# Patient Record
Sex: Male | Born: 1960 | Race: White | Hispanic: No | Marital: Married | Smoking: Never smoker
Health system: Southern US, Community
[De-identification: ages and names within clinical notes are randomized; demographics above are authoritative.]

## PROBLEM LIST (undated history)

## (undated) HISTORY — PX: SPINE SURGERY: SHX786

---

## 2005-04-06 ENCOUNTER — Ambulatory Visit (HOSPITAL_COMMUNITY)
Admission: RE | Admit: 2005-04-06 | Discharge: 2005-04-06 | Payer: Self-pay | Admitting: Physical Medicine and Rehabilitation

## 2005-04-30 ENCOUNTER — Ambulatory Visit (HOSPITAL_COMMUNITY): Admission: RE | Admit: 2005-04-30 | Discharge: 2005-05-01 | Payer: Self-pay | Admitting: Specialist

## 2007-08-28 ENCOUNTER — Encounter: Admission: RE | Admit: 2007-08-28 | Discharge: 2007-08-28 | Payer: Self-pay | Admitting: Orthopedic Surgery

## 2007-09-13 ENCOUNTER — Inpatient Hospital Stay (HOSPITAL_COMMUNITY): Admission: AD | Admit: 2007-09-13 | Discharge: 2007-09-14 | Payer: Self-pay | Admitting: Orthopedic Surgery

## 2009-02-14 ENCOUNTER — Encounter: Admission: RE | Admit: 2009-02-14 | Discharge: 2009-02-14 | Payer: Self-pay | Admitting: Emergency Medicine

## 2009-02-25 ENCOUNTER — Ambulatory Visit (HOSPITAL_COMMUNITY): Admission: RE | Admit: 2009-02-25 | Discharge: 2009-02-25 | Payer: Self-pay | Admitting: Emergency Medicine

## 2009-08-25 ENCOUNTER — Inpatient Hospital Stay (HOSPITAL_COMMUNITY): Admission: RE | Admit: 2009-08-25 | Discharge: 2009-08-28 | Payer: Self-pay | Admitting: Neurosurgery

## 2010-03-31 ENCOUNTER — Encounter: Admission: RE | Admit: 2010-03-31 | Discharge: 2010-03-31 | Payer: Self-pay | Admitting: Emergency Medicine

## 2010-04-09 ENCOUNTER — Ambulatory Visit: Payer: Self-pay | Admitting: Oncology

## 2010-04-16 LAB — CBC WITH DIFFERENTIAL/PLATELET
BASO%: 0.7 % (ref 0.0–2.0)
EOS%: 1.6 % (ref 0.0–7.0)
HCT: 39.9 % (ref 38.4–49.9)
MCH: 33.2 pg (ref 27.2–33.4)
MCHC: 33.8 g/dL (ref 32.0–36.0)
NEUT%: 62.4 % (ref 39.0–75.0)
RBC: 4.07 10*6/uL — ABNORMAL LOW (ref 4.20–5.82)
RDW: 12.7 % (ref 11.0–14.6)
lymph#: 1.6 10*3/uL (ref 0.9–3.3)

## 2010-04-16 LAB — COMPREHENSIVE METABOLIC PANEL
ALT: 23 U/L (ref 0–53)
AST: 17 U/L (ref 0–37)
Calcium: 9.7 mg/dL (ref 8.4–10.5)
Chloride: 107 mEq/L (ref 96–112)
Creatinine, Ser: 0.88 mg/dL (ref 0.40–1.50)

## 2010-05-25 ENCOUNTER — Encounter: Payer: Self-pay | Admitting: Emergency Medicine

## 2010-05-31 ENCOUNTER — Emergency Department (HOSPITAL_COMMUNITY)
Admission: EM | Admit: 2010-05-31 | Discharge: 2010-05-31 | Payer: Self-pay | Source: Home / Self Care | Admitting: Emergency Medicine

## 2010-05-31 LAB — DIFFERENTIAL
Basophils Absolute: 0 10*3/uL (ref 0.0–0.1)
Lymphocytes Relative: 14 % (ref 12–46)
Lymphs Abs: 1.7 10*3/uL (ref 0.7–4.0)
Monocytes Absolute: 0.6 10*3/uL (ref 0.1–1.0)
Neutro Abs: 9.7 10*3/uL — ABNORMAL HIGH (ref 1.7–7.7)

## 2010-05-31 LAB — URINALYSIS, ROUTINE W REFLEX MICROSCOPIC
Ketones, ur: NEGATIVE mg/dL
Nitrite: NEGATIVE
Protein, ur: NEGATIVE mg/dL
Urobilinogen, UA: 0.2 mg/dL (ref 0.0–1.0)

## 2010-05-31 LAB — CBC
HCT: 41.2 % (ref 39.0–52.0)
Hemoglobin: 14.6 g/dL (ref 13.0–17.0)
MCV: 92.4 fL (ref 78.0–100.0)
WBC: 12.2 10*3/uL — ABNORMAL HIGH (ref 4.0–10.5)

## 2010-05-31 LAB — COMPREHENSIVE METABOLIC PANEL
ALT: 23 U/L (ref 0–53)
Alkaline Phosphatase: 63 U/L (ref 39–117)
CO2: 29 mEq/L (ref 19–32)
GFR calc non Af Amer: >60 mL/min (ref >60)
Glucose, Bld: 90 mg/dL (ref 70–99)
Potassium: 4.1 mEq/L (ref 3.5–5.1)
Sodium: 141 mEq/L (ref 135–145)
Total Bilirubin: 1.2 mg/dL (ref 0.3–1.2)

## 2010-07-21 LAB — CBC
HCT: 33.2 % — ABNORMAL LOW (ref 39.0–52.0)
HCT: 39.1 % (ref 39.0–52.0)
Hemoglobin: 11.6 g/dL — ABNORMAL LOW (ref 13.0–17.0)
MCHC: 35.4 g/dL (ref 30.0–36.0)
MCV: 97.8 fL (ref 78.0–100.0)
MCV: 97.9 fL (ref 78.0–100.0)
Platelets: 219 10*3/uL (ref 150–400)
RDW: 12.5 % (ref 11.5–15.5)
RDW: 12.8 % (ref 11.5–15.5)
WBC: 6.4 10*3/uL (ref 4.0–10.5)

## 2010-07-21 LAB — BASIC METABOLIC PANEL
BUN: 4 mg/dL — ABNORMAL LOW (ref 6–23)
BUN: 8 mg/dL (ref 6–23)
Chloride: 102 mEq/L (ref 96–112)
Chloride: 106 mEq/L (ref 96–112)
GFR calc non Af Amer: >60 mL/min (ref >60)
GFR calc non Af Amer: >60 mL/min (ref >60)
Glucose, Bld: 102 mg/dL — ABNORMAL HIGH (ref 70–99)
Glucose, Bld: 85 mg/dL (ref 70–99)
Potassium: 3.6 mEq/L (ref 3.5–5.1)
Potassium: 5.7 mEq/L — ABNORMAL HIGH (ref 3.5–5.1)
Sodium: 136 mEq/L (ref 135–145)

## 2010-07-21 LAB — POTASSIUM: Potassium: 4 mEq/L (ref 3.5–5.1)

## 2010-09-15 NOTE — Op Note (Signed)
NAMEBARRIE, WALE                ACCOUNT NO.:  [id number]   MEDICAL RECORD NO.:  [id number]          PATIENT TYPE:  OIB   LOCATION:  5035                         FACILITY:  [facility]   PHYSICIAN:  Alvy Beal, MD    DATE OF BIRTH:  12/22/1960   DATE OF PROCEDURE:  DATE OF DISCHARGE:                               OPERATIVE REPORT   PREOPERATIVE DIAGNOSIS:  Recurrent right L5-S1 disk herniation.   POSTOPERATIVE DIAGNOSIS:  Recurrent right L5-S1 disk herniation.   OPERATIVE PROCEDURE:  Lumbar laminectomy for diskectomy revision.   FIRST ASSISTANT:  Crissie Reese, PA   HISTORY:  Malcome is a very pleasant 50 year old gentleman who presents  with acute onset of severe right leg pain and slight weakness in the  right S1 distribution.  At this point, because of the patient's symptoms  an MRI was done, and I confirmed the sequestered fragment of disk  material due to recurrent L5-S1 disk herniation.  Attempts at  conservative management failed to alleviate symptoms.  All appropriate  risks, benefits, and alternatives were explained to the patient and  elected to proceed with revision diskectomy.   OPERATIVE NOTE:  The patient is brought to the operating room, placed  supine on the operating table.  After successful induction of general  anesthesia and endotracheal intubation, the SCDs and Foley were applied.  He was turned prone onto a Wilson frame and the back was prepped in  sterile fashion.  The previous lumbar incision was then marked out with  needles and then re-incised.  Sharp dissection was carried out down to  the deep fascia and spinous process.  I then exposed the spinous process  L5 and S1 in a portion of that of S2.  I then began clearing a soft  tissue to expose the lamina and L5-S1 facet complex and lamina of L4.  Self-retaining Taylor retractor was placed and I placed a probe  underneath the L5 lamina.  I confirmed with lateral plain radiographs  that was at the L5  lamina.  Once I confirmed this, because of the  location of the disk, I elected to perform a complete S1 laminectomy.  Using a fine curette, I developed a plain underneath the S1 lamina, and  then using a combination of 1, 2, and 3-mm Kerrison.  I performed a  complete laminectomy of S1.  At this point, I had clear visualizations  of the lateral gutter, and the S1 nerve root.  I then gently began  mobilizing the thecal sac.  I could clearly visualize the medial border  of the S1 pedicle.  I then began sweeping underneath the thecal sac with  a nerve hook and there was a two large fragments of disk material.  I  resected those very carefully to prevent any dural leak with a micro and  regular pituitary rongeur.  These very large fragments of disk were  removed then I noted that there was a significant decrease in the  tension on the thecal sac and S1 nerve root.  At this point, I irrigated  the wound copiously with normal  saline, and then took a long nerve hook,  and was able to sweep circumferentially underneath the thecal sac and  inferiorly down to the about the level of the S1-2 disk space and up  caudally up to the L5-S1 disk space.  I was also able to palpate the  lateral recess from the L5-S1 disk down to the S1-S2 disk.  I then  palpated with a hockey stick (dural elevator) out the S1 neuroforamen  and I noted there was no significant undue pressure.  At this point, I  had removed very large fragments of disk material consistent with that  which I visualized on the preoperative MRI.  In addition based on  clinical direct evaluation and palpation and Healon, there was no  further retained fragments of disk material.   Satisfied with the diskectomy.  I irrigated the wound copiously with  normal saline used bipolar electrocautery to obtain hemostasis and  maintained with FloSeal.  I then closed the deep fascia with interrupted  #1 Vicryl sutures,  superficial 2-0 Vicryl sutures, and  3-0 Monocryl for the skin.  Steri-  Strips and dry dressings were applied.  He was extubated and transferred  PACU without incident.  At the end of the case, all needle, and sponge  counts were correct.      Alvy Beal, MD  Electronically Signed     DDB/MEDQ  D:  09/13/2007  T:  09/14/2007  Job:  [id number]

## 2010-09-18 NOTE — Consult Note (Signed)
[name], [name]                ACCOUNT NO.:  [id number]   MEDICAL RECORD NO.:  [id number]          PATIENT TYPE:  OIB   LOCATION:  5011                         FACILITY:  [facility]   PHYSICIAN:  Lindaann Slough, M.D.  DATE OF BIRTH:  July 16, 1960   DATE OF CONSULTATION:  04/30/2005  DATE OF DISCHARGE:  05/01/2005                                   CONSULTATION   REASON FOR CONSULTATION:  Inability to urinate.   HISTORY OF PRESENT ILLNESS:  The patient is a 50 year old male who had  microdiskectomy L5-S1 today by Dr. Otelia Sergeant. Postoperatively, he had difficulty  voiding. A #16 Foley catheter was inserted in the bladder and drained 1500  cc of urine. The patient stated that in the past, he had difficulty voiding  when he had a thumb surgery 15 years ago and also when he takes analgesics.  For the past year and a half, he has been on Flomax and if he does not take  Flomax, he has hesitancy, straining on urination and sensation of incomplete  emptying of the bladder. He has no hematuria. He has no history of urethral  trauma.   PAST MEDICAL HISTORY:  Negative.   MEDICATIONS:  Flomax 1 capsule daily.   ALLERGIES:  NO KNOWN DRUG ALLERGIES.   PAST SURGICAL HISTORY:  He had left thumb surgery 15 years ago.   SOCIAL HISTORY:  He is married and has 3 children. Does not smoke. Drinks  occasionally.   FAMILY HISTORY:  His father died at age 49. His mother died at age 79 of  unknown causes to him. He has 2 sisters and 1 brother.   REVIEW OF SYSTEMS:  He is status post microdiskectomy and he had difficulty  voiding postoperatively. Everything else is negative.   PHYSICAL EXAMINATION:  GENERAL:  This is a well built 50 year old male who  is in no acute distress at this time.  NEUROLOGIC:  He is oriented to person, place, and time.  VITAL SIGNS:  Blood pressure 115/80, pulse 74, respiratory rate 16,  temperature 97.6.  SKIN:  Warm and dry.  ABDOMEN:  Soft, nontender, and nondistended. He has  no CVA tenderness. He  has no hepatosplenomegaly and no splenomegaly. He has no inguinal hernia.  GENITOURINARY:  Kidney's are not palpable. Bladder is not distended. He has  no tenderness in the suprapubic area. Penis is circumcised. Meatus is  normal. He has a Foley catheter in place that is draining clear urine.  Scrotum is normal. He has no hydrocele. No testicular mass. Cords and  epididymides are within normal limits. Anal and perineal skin is normal.  RECTAL:  Sphincter tone is normal. He has no external hemorrhoids. Prostate  is enlarged at 30 grams. No nodules, seminal vesicles not palpable.   IMPRESSION:  Urinary retention.   PLAN:  Continue Flomax 0.4 mg daily and remove Foley catheter in the morning  for voiding trial. Can be discharged on Flomax 1 or 2 cap daily.  If he  continues to have difficulty voiding, we will then replace the Foley  catheter.  He could then go  home with Foley catheter and we will follow him  up as an outpatient.      Lindaann Slough, M.D.  Electronically Signed     MN/MEDQ  D:  05/01/2005  T:  05/02/2005  Job:  [id number]   cc:   Kerrin Champagne, M.D.  Fax: [phone]

## 2010-09-18 NOTE — Op Note (Signed)
NAMEMOISHE, SCHELLENBERG                ACCOUNT NO.:  [id number]   MEDICAL RECORD NO.:  [id number]          PATIENT TYPE:  OIB   LOCATION:  5011                         FACILITY:  [facility]   PHYSICIAN:  Kerrin Champagne, M.D.   DATE OF BIRTH:  [birth year]   DATE OF PROCEDURE:  04/30/2005  DATE OF DISCHARGE:  05/01/2005                                 OPERATIVE REPORT   PREOPERATIVE DIAGNOSIS:  Herniated nucleus pulposus right L5-S1 with  persistent recurring right S1 radiculopathy.   POSTOPERATIVE DIAGNOSIS:  Herniated nucleus pulposus right L5-S1 with  persistent recurring right S1 radiculopathy.   PROCEDURE:  Right L5-S1 microdiskectomy.   SURGEON:  Kerrin Champagne, M.D.   ASSISTANT:  Maud Deed, P.A.-C.   ANESTHESIA:  General via orotracheal intubation supplemented with local  infiltration of Marcaine 0.5% with 1:200,000 epinephrine 90 mL.   ATTENDING ANESTHESIOLOGIST:  Burna Forts, M.D.   SPECIMENS:  Disk material right L5-S1.   ESTIMATED BLOOD LOSS:  10 cc.   COMPLICATIONS:  The patient returned to PACU in good condition.   CLINICAL HISTORY:  The patient is a 50 year old male who injured his back  almost a period of four years ago.  He underwent a nucleoplasty and did well  up until this past August where he was working as a Designer, fashion/clothing and had worsening  pain and discomfort.  He noticed that he jumped from a bridge to inspect  wreckage where his son had driven off an embankment or bridge and apparently  misjudged the height.  He jumped through trees, landing on the ground at 35  feet below.  He had been experiencing pain and discomfort since that time.  The pain is down the posterior aspect of his right leg in S1 distribution.  He has reflexes which are relatively consistent and symmetric, both sides,  positive straight leg raise sign on the right, positive popliteal  compression sign.  Weakness with toe walking on the right side. The  patient's MRI study shows protruded  disk, right side L5-S, retrolisthesis of  L5 and S1.  There is displacement of the right S1 nerve root secondary to  HNP which is prominent and located right at the right posterior disk margin.   INTRAOPERATIVE FINDINGS:  The patient found to have focal disk protrusion  right side central affecting primarily the right side of thecal sac, right  S1 nerve root.   DESCRIPTION OF PROCEDURE:  After adequate general anesthesia, the patient a  knee-chest position, Andrews frame, standard preoperative antibiotics.  Standard prep.  All pressure points well-padded.  Prepped with DuraPrep  solution, draped in the usual manner.  Spinal needles placed at the expected  L5-S1 level.  Intraoperative lateral radiograph demonstrating the upper  needle directly across from the disk.  The incision was then based at this  level after infiltration of the skin with Marcaine 0.5% with 1:200  epinephrine.  The incision was approximately an inch in length, through skin  and subcutaneous layers directly down to the lumbar dorsal fascia.  The  lumbar dorsal fascia incised on the right side of  spinous process of S1 and  of L5.  Cobb used to elevate paralumbar muscles on the right side off of the  right side lamina of L5 exposing the posterior aspect the L5 lamina, the  inferior aspect and the L5-S1 posterior laminar space on the right side.  McCullough retractor inserted.  The soft tissue around the area of the  incision was then infiltrated with Marcaine 0.5% with 200,000 epinephrine,  an additional 3 or 4 mL.  Next, a curet was used carefully to free up the  ligament of flavum on the right side, off of the superior aspect of the  lamina of S1 and over the medial aspect of the facet at the L5-S1 level.  A  3 mm Kerrison was used to remove a small portion on the inferior aspect  lamina on the right side forming a semi-hemilaminectomy on this side,  widening the opening of the interlaminar space.  Next a nerve hook  used to  carefully elevate the ligament of flavum off of the S1 lamina and 3 mm  Kerrison then used to resect the ligament of flavum on the right side.  The  patient had a generous amount epidural fat along the right side.  This was  preserved. The facet capsule on the right side was then debrided using 3 mm  Kerrison. Lateral recess decompressed with this.  The S1 nerve root  identified and then carefully freed up off the lateral margin of the lateral  recess and the S1 pedicle and retracted medially using a D'Errico.  Disk  rupture noted at the L5-S1 disk space on the right side immediately present.  With the lateral aspect of thecal sac and S1 nerve root carefully protected  with the D'Errico, then a 15 blade scalpel used to incise longitudinally  the disk on the right side L5-S1 immediately disk material extruded  indicating that the disk was under pressure and was causing mass effect on  his S1 nerve root.  The extruded disk material was then extracted using  micropituitaries as well as straight pituitaries with teeth.  The disk space  was then entered lightly using micropituitary, upbiting straight and  downbiting pituitary debriding disk material locally and superficially where  it was loose and may cause recurrent disk protrusion otherwise.  This  completed, then a hockey stick neural probe was then used and passed out the  S1 neural foramen, out the L5 neural foramen over the posterior aspect of  disk indicating that it was completely decompressed.   Irrigation was performed.  There was no active bleeding evident.  The nerve  root allowed to fall back into place.  A small epidural vein was cauterized  using bipolar electrocautery. At this point, all extraneous material was  removed including cottonoids and then Gelfoam.  Soft tissues allowed to fall  back into place.  A small bleeder within the paralumbar muscles was cauterized over the S1 level using electrocautery.  Lumbodorsal  fascia was  then approximated in the midline with interrupted sutures of 0 Vicryl with  the UR-6 needle.  Note the operating room microscope was brought into field  initially prior to removal of the ligament of flavum and all throughout the  excision of disk along the right side and used also for the closure portion  with the placement of sutures in the lumbodorsal fascia using 0 Vicryl  sutures and the subcu layers with 0  Vicryl sutures.  Operating microscope  was then brought out of  the field and the subcu layers approximated with  interrupted 2-0 Vicryl suture and the skin closed with a running subcu  stitch of 4-0 Vicryl.  Tincture of Benzoin and Steri-Strips applied; 4x4s  affixed to the skin with Hypafix tape.  The patient then returned to the  supine position, reactivated, extubated, returned recovery room in  satisfactory condition.      Kerrin Champagne, M.D.  Electronically Signed     JEN/MEDQ  D:  04/30/2005  T:  05/01/2005  Job:  [id number]

## 2011-04-14 IMAGING — CT CT PELVIS W/ CM
2 of 5 series · 16 of 46 positions shown, 18 images · IV contrast (CONTRAST)
Comparison: None

CT ABDOMEN

CLINICAL DATA: Elevated liver function tests.  Bloating.
Diarrhea.  History of prior melanoma.

CT ABDOMEN AND PELVIS WITH CONTRAST
TECHNIQUE: Multidetector CT imaging of the abdomen and pelvis was
performed following the standard protocol following the bolus
administration of intravenous contrast.
Contrast: 100  ml Mmnipaque-GOO

[Series 2: portal · axial · portal-venous · 0.76mm/px · z∈[+1112,+1562]mm · 13 of 103 slices shown, 15 images]
[im 7/103  soft-tissue]
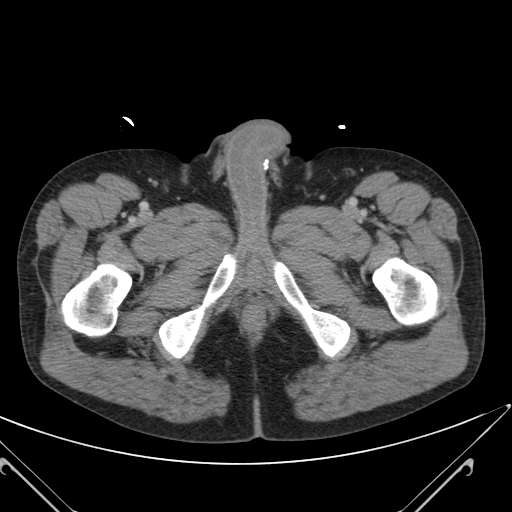
[im 7/103  bone]
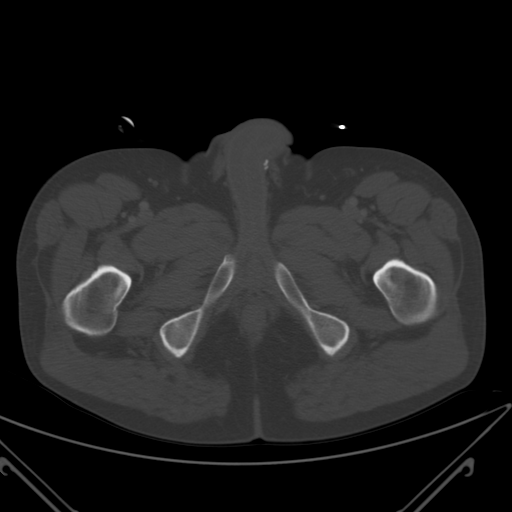
[im 13/103  soft-tissue]
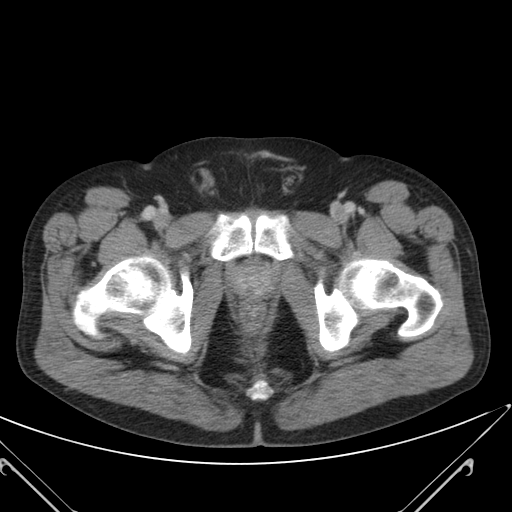
[im 25/103  soft-tissue]
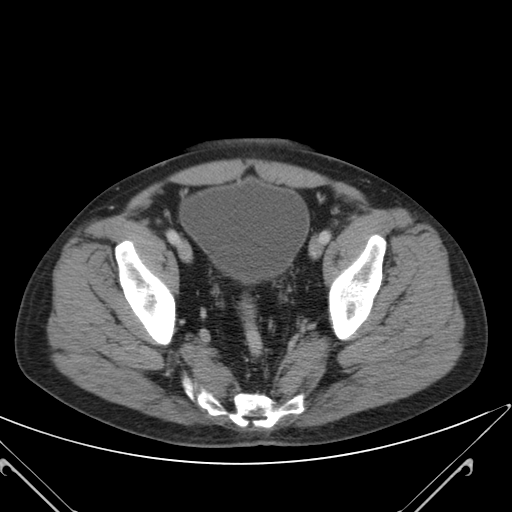
[im 31/103  soft-tissue]
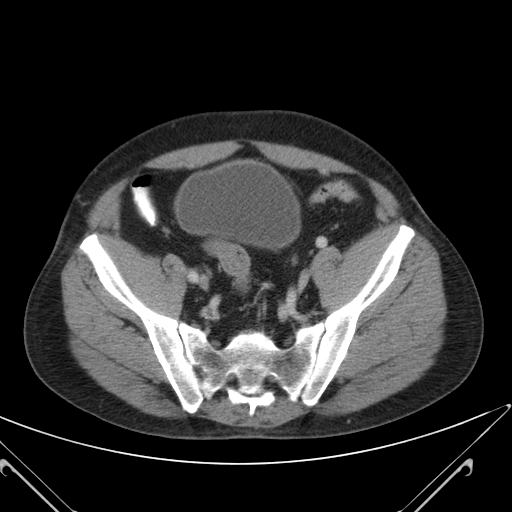
[im 37/103  soft-tissue]
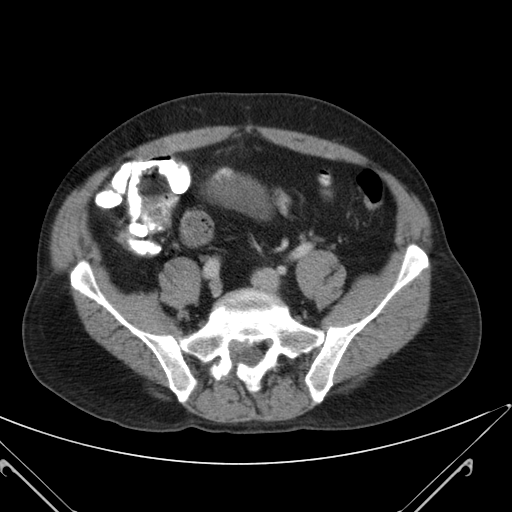
[im 43/103  soft-tissue]
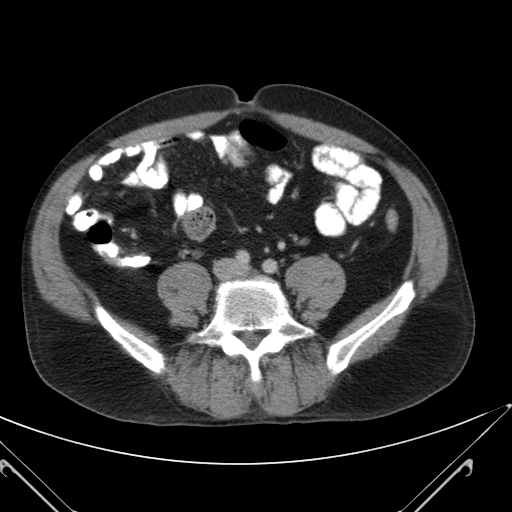
[im 55/103  soft-tissue]
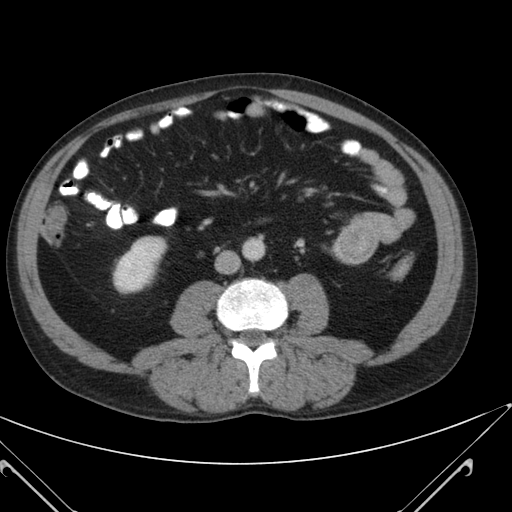
[im 61/103  soft-tissue]
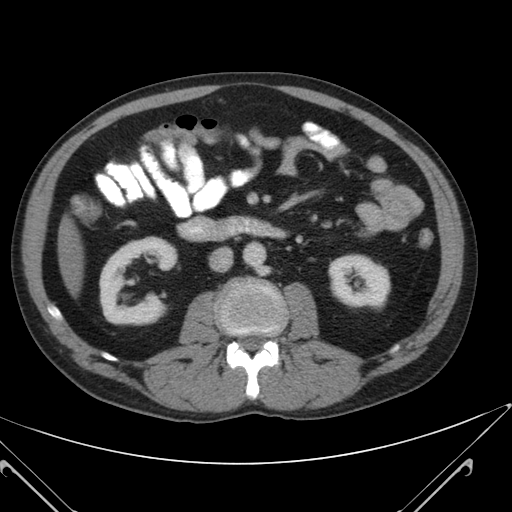
[im 67/103  soft-tissue]
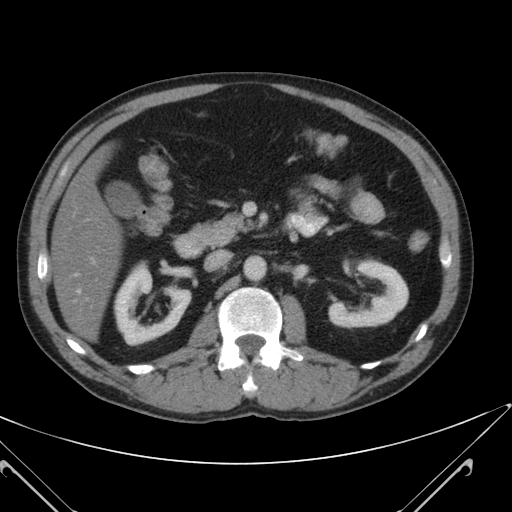
[im 67/103  bone]
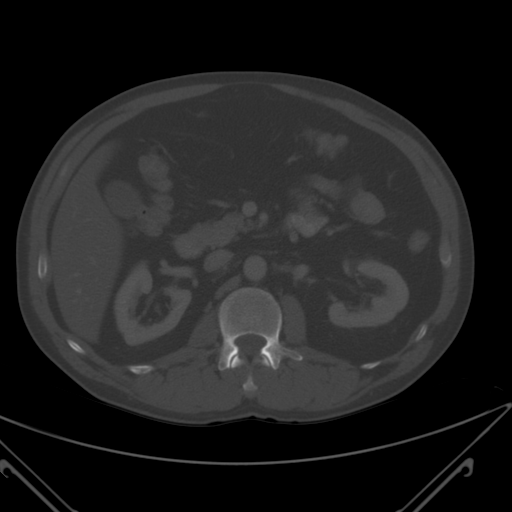
[im 73/103  soft-tissue]
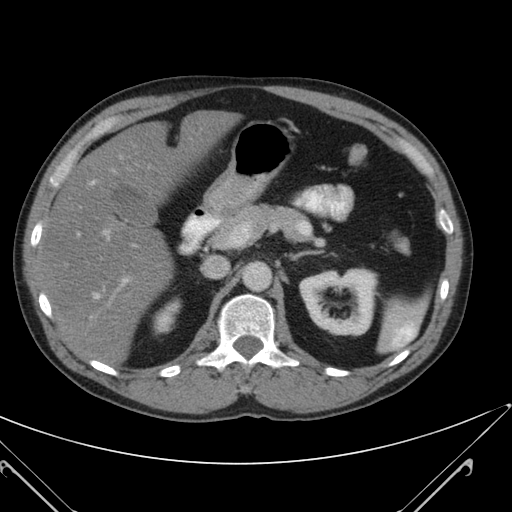
[im 79/103  soft-tissue]
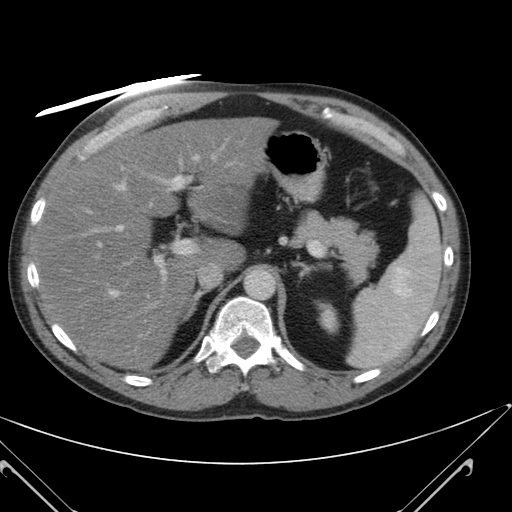
[im 91/103  soft-tissue]
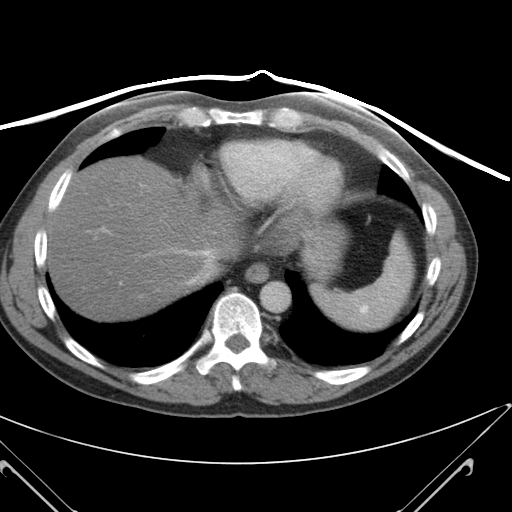
[im 97/103  soft-tissue]
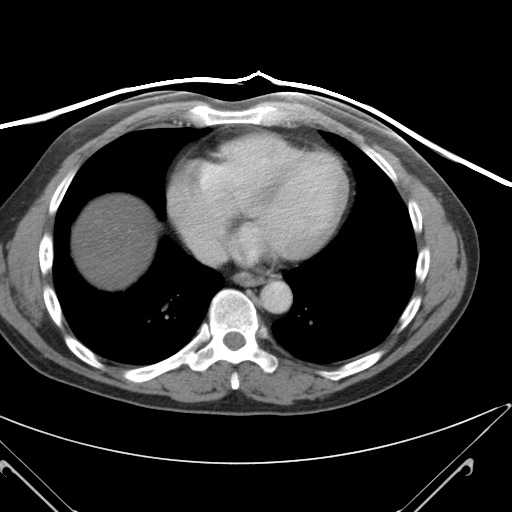

[coronals · coronal · 0.99mm/px · 3 of 91 slices shown]
[im 31/91  soft-tissue]
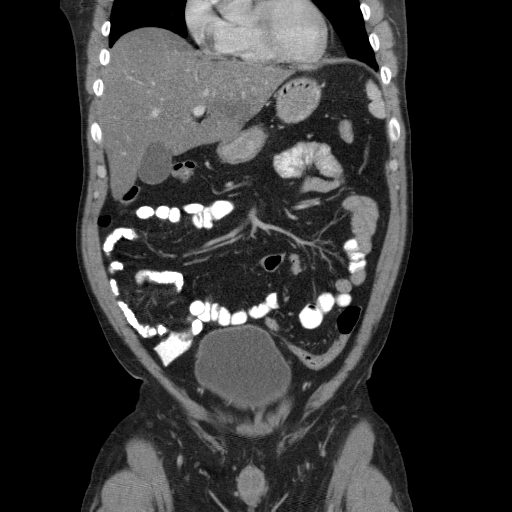
[im 41/91  soft-tissue]
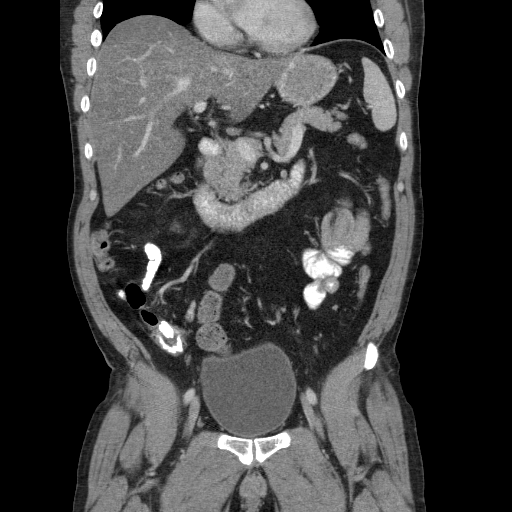
[im 51/91  soft-tissue]
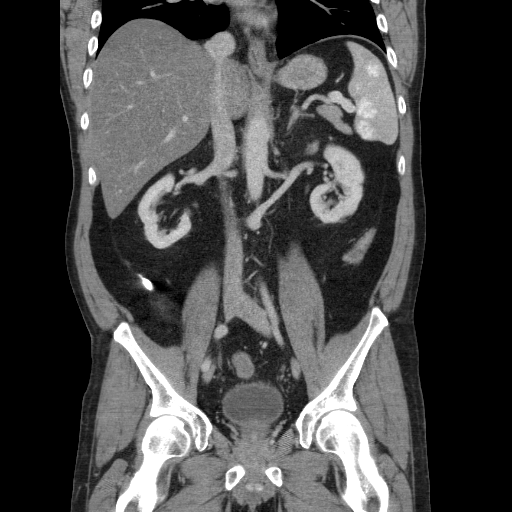

[16 of 46 positions shown; findings below may reference images not displayed]

FINDINGS: Clear lung bases.  Normal heart size without pericardial
or pleural effusion.  Moderate to marked fatty infiltration of the
liver with enlargement of the right lobe.  No focal liver lesions.
More focal hypoattenuation in the posterior aspect lateral segment
left liver lobe on image 23 is secondary to focal fat deposition.
Numerous peripheral enhancing lesions identified throughout the
spleen.  These are relatively isoattenuating on delayed imaging.
An example lesion measures 2.4 cm on image 27 of series 2.  The
spleen is normal in size.

Normal stomach, pancreas, gallbladder, biliary tract, adrenal
glands.  Mild renal cortical atrophy bilaterally.  Normal kidneys.
Circumaortic left renal vein. No retroperitoneal or retrocrural
adenopathy. Normal colon and terminal ileum.  A small bowel
intussusception is identified on images 44 through 53 of series 2
and image 42 of coronals.  No underlying mass is identified.  This
is present on delayed images and measures maximally 3.7 cm on image
94 sagittal.  No small bowel wall thickening.

No ascites.
IMPRESSION: 1.  Moderate to marked fatty infiltration of the liver with
enlargement of the right lobe up to 19.5 cm.
2.  No other explanation for elevated liver function tests.
3.  Numerous hyperenhancing splenic lesions.  Statistically, most
likely to represent benign entities such as hemangiomas or
hamartomas.  Given the history of melanoma, metastatic disease
cannot entirely be excluded (isolated metastasis to the spleen are
extremely rare but have been described in patients with melanoma).
If the melanoma was localized and the risk of metastatic disease is
low, these splenic lesions could be followed with ultrasound to
confirm ongoing size stability.  If melanoma metastasis risk is
high, further evaluation with PET should be considered.
4.  Small bowel intussusception without obstruction.  The majority
of such small bowel intussusceptions are idiopathic and incidental.
No underlying mass is identified.  If the patient undergoes follow-
up imaging, including PET, attention should be paid to this area to
exclude unlikely underlying causative lesion.

CT PELVIS
FINDINGS: Normal pelvic bowel loops.  No pelvic adenopathy.
Normal urinary bladder and prostate.  No significant free fluid.
Prior lumbar spine surgery. No acute osseous abnormality. Trace L5-
S1 anterolisthesis
IMPRESSION: No acute pelvic process.

## 2011-09-14 ENCOUNTER — Encounter: Payer: Self-pay | Admitting: Emergency Medicine

## 2011-09-14 ENCOUNTER — Ambulatory Visit: Payer: BC Managed Care – PPO

## 2011-09-14 ENCOUNTER — Ambulatory Visit (INDEPENDENT_AMBULATORY_CARE_PROVIDER_SITE_OTHER): Payer: BC Managed Care – PPO | Admitting: Emergency Medicine

## 2011-09-14 VITALS — BP 111/74 | HR 64 | Temp 97.5°F | Resp 16 | Ht 69.0 in | Wt 152.0 lb

## 2011-09-14 DIAGNOSIS — G8929 Other chronic pain: Secondary | ICD-10-CM

## 2011-09-14 DIAGNOSIS — R634 Abnormal weight loss: Secondary | ICD-10-CM

## 2011-09-14 DIAGNOSIS — R413 Other amnesia: Secondary | ICD-10-CM

## 2011-09-14 DIAGNOSIS — N3281 Overactive bladder: Secondary | ICD-10-CM | POA: Insufficient documentation

## 2011-09-14 DIAGNOSIS — M79605 Pain in left leg: Secondary | ICD-10-CM

## 2011-09-14 DIAGNOSIS — Z0000 Encounter for general adult medical examination without abnormal findings: Secondary | ICD-10-CM

## 2011-09-14 DIAGNOSIS — M21379 Foot drop, unspecified foot: Secondary | ICD-10-CM | POA: Insufficient documentation

## 2011-09-14 DIAGNOSIS — M545 Low back pain: Secondary | ICD-10-CM

## 2011-09-14 LAB — TSH: TSH: 1.929 u[IU]/mL (ref 0.350–4.500)

## 2011-09-14 LAB — COMPREHENSIVE METABOLIC PANEL
ALT: 13 U/L (ref 0–53)
BUN: 15 mg/dL (ref 6–23)
CO2: 26 mEq/L (ref 19–32)
Calcium: 9.2 mg/dL (ref 8.4–10.5)
Chloride: 106 mEq/L (ref 96–112)
Creat: 1.05 mg/dL (ref 0.50–1.35)
Glucose, Bld: 89 mg/dL (ref 70–99)

## 2011-09-14 LAB — POCT URINALYSIS DIPSTICK
Blood, UA: NEGATIVE
Glucose, UA: NEGATIVE
Ketones, UA: NEGATIVE
Spec Grav, UA: <=1.005

## 2011-09-14 LAB — CBC WITH DIFFERENTIAL/PLATELET
Eosinophils Relative: 4 % (ref 0–5)
HCT: 42.9 % (ref 39.0–52.0)
Lymphocytes Relative: 32 % (ref 12–46)
Lymphs Abs: 1.9 10*3/uL (ref 0.7–4.0)
MCH: 31.5 pg (ref 26.0–34.0)
MCV: 93.9 fL (ref 78.0–100.0)
Monocytes Absolute: 0.4 10*3/uL (ref 0.1–1.0)
Monocytes Relative: 7 % (ref 3–12)
RBC: 4.57 MIL/uL (ref 4.22–5.81)
WBC: 6 10*3/uL (ref 4.0–10.5)

## 2011-09-14 LAB — LIPID PANEL
Cholesterol: 171 mg/dL (ref 0–200)
HDL: 37 mg/dL — ABNORMAL LOW (ref >39)
Triglycerides: 157 mg/dL — ABNORMAL HIGH (ref <150)

## 2011-09-14 NOTE — Progress Notes (Signed)
  Subjective:    Patient ID: [name], male    DOB: 05/28/1960, 51 y.o.   MRN: [id number]  HPI patient presents for his yearly physical exam. Overall he is doing the same. He has not lost any more weight. He is having increasing problems with short-term memory and wants that checked    Review of Systems  Constitutional:       Weight has been stable he fluctuates at times but has been stable recently  HENT: Negative.   Eyes:       His problems with an alternating exotropia  Respiratory:       Wife has concerns about asbestos exposure when he was young but no exposure recently. Patient denies symptoms .  Cardiovascular: Negative.   Gastrointestinal:       He's had previous GI workup with Dr. Elnoria Howard  Genitourinary:       He has chronic problems with urgency. He has Dr. Brunilda Payor seeing him currently. Dr. Brunilda Payor  would like copies of all his blood work . He did have some dizziness he thought was secondary to a new medication he was taking for his urinary symptoms.  Musculoskeletal:       He has had 4 back surgeries. 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 The wife states he has significant problems with short-term memory and the symptoms have been going on for a good while.  Psychiatric/Behavioral: Negative.         Objective:   Physical Exam  Constitutional: He is oriented to person, place, and time. He appears well-developed and well-nourished.  HENT:  Head: Normocephalic and atraumatic.  Right Ear: External ear normal.  Left Ear: External ear normal.  Eyes:       He is an alternating exotropia  Neck: Normal range of motion. Neck supple. No tracheal deviation present. No thyromegaly present.  Cardiovascular: Normal rate, regular rhythm, normal heart sounds and intact distal pulses.  Exam reveals no gallop and no friction rub.   No murmur  heard. Pulmonary/Chest: Effort normal and breath sounds normal. No respiratory distress. He has no wheezes. He has no rales. He exhibits no tenderness.  Abdominal: Soft. Bowel sounds are normal. He exhibits no distension. There is no tenderness. There is no rebound.  Musculoskeletal: Normal range of motion.  Neurological: He is alert and oriented to person, place, and time. No cranial nerve deficit. Coordination normal.       He did appear times to have some memory issues. He had to be reminded of the day while was in the room. He has a scar over the lumbar spine associated with a right calf atrophy and right foot drop.  Skin: Skin is warm and dry.  Psychiatric: He has a normal mood and affect.   [facility] reading (PRIMARY) by  Dr. Cleta Alberts NAD         Assessment & Plan:  We'll check baseline labs results be forwarded to Dr. Brunilda Payor. Appointment to be made for an MRI with contrast vault neurological referral to be evaluated for memory issues. Dementia labs were ordered.

## 2011-09-15 LAB — RPR

## 2011-09-17 ENCOUNTER — Telehealth: Payer: Self-pay

## 2011-09-17 NOTE — Telephone Encounter (Signed)
Pts wife called to ask if a specific test for melanoma was run with pts bloodwork during his cpe with dr Cleta Alberts this past Tuesday. Please call pt.  Best: [phone] or [phone]  bf

## 2011-09-18 NOTE — Telephone Encounter (Signed)
No such test exists. If patient is concerned, should see a dermatologist for evaluation.

## 2011-09-18 NOTE — Telephone Encounter (Signed)
Please advise... Do we do this??

## 2011-09-19 NOTE — Telephone Encounter (Signed)
Left message for patient's wife with Ryan's message.

## 2011-09-22 ENCOUNTER — Ambulatory Visit
Admission: RE | Admit: 2011-09-22 | Discharge: 2011-09-22 | Disposition: A | Discharge status: Home / Self Care | Payer: Self-pay | Source: Ambulatory Visit | Attending: Emergency Medicine | Admitting: Emergency Medicine

## 2011-09-22 DIAGNOSIS — R413 Other amnesia: Secondary | ICD-10-CM

## 2011-11-22 ENCOUNTER — Encounter: Payer: Self-pay | Admitting: Emergency Medicine

## 2013-06-15 ENCOUNTER — Ambulatory Visit: Payer: BC Managed Care – PPO | Admitting: Emergency Medicine

## 2013-06-15 VITALS — BP 110/72 | HR 63 | Temp 97.7°F | Resp 18 | Ht 70.0 in | Wt 163.0 lb

## 2013-06-15 DIAGNOSIS — M545 Low back pain, unspecified: Secondary | ICD-10-CM

## 2013-06-15 DIAGNOSIS — M216X9 Other acquired deformities of unspecified foot: Secondary | ICD-10-CM

## 2013-06-15 DIAGNOSIS — M21371 Foot drop, right foot: Secondary | ICD-10-CM

## 2013-06-15 MED ORDER — HYDROCODONE-ACETAMINOPHEN 10-325 MG PO TABS: ORAL_TABLET | ORAL | Status: AC

## 2013-06-15 NOTE — Patient Instructions (Signed)
We are scheduling you an appointment to be seen by a pain management specialist. You had been instructed to not take your pain medications if you're working on ladders or  on a roof.

## 2013-06-15 NOTE — Progress Notes (Signed)
   Subjective:    Patient ID: [name], male    DOB: 1961/02/02, 53 y.o.   MRN: [id number]  HPI patient here with chronic low back pain. Patient has had 3 back surgeries. He has a chronic right foot drop following his second surgery. His last surgery was performed by [name] with a fusion. He was sent back to see [name] to see if he would benefit from epidurals and most recently and electrolytes this to see if that would help with pain relief. They have recently been unhappy since his pain medication which changed to tramadol. He had been stable on hydrocodone for years and taking hydrocodone one in the morning at times one in the afternoon with the Valium at night to help him sleep. He has been off of this regimen since the end of last year.    Review of Systems     Objective:   Physical Exam there are healed scars over the lower lumbar spine. Deep tendon reflexes in the knees are 2+. He has a right foot drop. He also has some weakness in extension of the right leg. Left leg exam is normal.        Assessment & Plan:  Referral made to [name] for chronic pain management. A complicating feature is that the patient works as a Designer, fashion/clothingroofer. He was advised that he cannot take hydrocodone and be on the the roof working. He can work in the back of the truck on level ground but not up on the roof while on these medications.

## 2013-09-26 ENCOUNTER — Encounter: Payer: Self-pay | Admitting: *Deleted

## 2013-09-26 DIAGNOSIS — J309 Allergic rhinitis, unspecified: Secondary | ICD-10-CM

## 2013-09-26 DIAGNOSIS — M961 Postlaminectomy syndrome, not elsewhere classified: Secondary | ICD-10-CM

## 2013-09-26 DIAGNOSIS — N400 Enlarged prostate without lower urinary tract symptoms: Secondary | ICD-10-CM

## 2013-09-26 DIAGNOSIS — Z85820 Personal history of malignant melanoma of skin: Secondary | ICD-10-CM

## 2013-09-26 DIAGNOSIS — M4310 Spondylolisthesis, site unspecified: Secondary | ICD-10-CM

## 2015-02-04 ENCOUNTER — Encounter: Payer: Self-pay | Admitting: Emergency Medicine

## 2020-01-25 ENCOUNTER — Telehealth (INDEPENDENT_AMBULATORY_CARE_PROVIDER_SITE_OTHER): Payer: Medicare Other | Admitting: Family Medicine

## 2020-01-25 ENCOUNTER — Encounter: Payer: Self-pay | Admitting: Family Medicine

## 2020-01-25 DIAGNOSIS — J208 Acute bronchitis due to other specified organisms: Secondary | ICD-10-CM | POA: Diagnosis not present

## 2020-01-25 DIAGNOSIS — R509 Fever, unspecified: Secondary | ICD-10-CM

## 2020-01-25 DIAGNOSIS — U071 COVID-19: Secondary | ICD-10-CM

## 2020-01-25 LAB — POC COVID19 BINAXNOW: SARS Coronavirus 2 Ag: POSITIVE — AB

## 2020-01-25 LAB — POCT INFLUENZA A/B
Influenza A, POC: NEGATIVE
Influenza B, POC: NEGATIVE

## 2020-01-25 NOTE — Patient Instructions (Signed)
10 Things You Can Do to Manage Your COVID-19 Symptoms at Home If you have possible or confirmed COVID-19: 1. Stay home from work and school. And stay away from other public places. If you must go out, avoid using any kind of public transportation, ridesharing, or taxis. 2. Monitor your symptoms carefully. If your symptoms get worse, call your healthcare provider immediately. 3. Get rest and stay hydrated. 4. If you have a medical appointment, call the healthcare provider ahead of time and tell them that you have or may have COVID-19. 5. For medical emergencies, call 911 and notify the dispatch personnel that you have or may have COVID-19. 6. Cover your cough and sneezes with a tissue or use the inside of your elbow. 7. Wash your hands often with soap and water for at least 20 seconds or clean your hands with an alcohol-based hand sanitizer that contains at least 60% alcohol. 8. As much as possible, stay in a specific room and away from other people in your home. Also, you should use a separate bathroom, if available. If you need to be around other people in or outside of the home, wear a mask. 9. Avoid sharing personal items with other people in your household, like dishes, towels, and bedding. 10. Clean all surfaces that are touched often, like counters, tabletops, and doorknobs. Use household cleaning sprays or wipes according to the label instructions. cdc.gov/coronavirus 11/01/2018 This information is not intended to replace advice given to you by your health care provider. Make sure you discuss any questions you have with your health care provider. Document Revised: 04/05/2019 Document Reviewed: 04/05/2019 Elsevier Patient Education  2020 [employer] Inc.  

## 2020-01-25 NOTE — Progress Notes (Signed)
Virtual Visit via Telephone Note   This visit type was conducted due to national recommendations for restrictions regarding the COVID-19 Pandemic (e.g. social distancing) in an effort to limit this patient's exposure and mitigate transmission in our community.  Due to his co-morbid illnesses, this patient is at least at moderate risk for complications without adequate follow up.  This format is felt to be most appropriate for this patient at this time.  The patient did not have access to video technology/had technical difficulties with video requiring transitioning to audio format only (telephone).  All issues noted in this document were discussed and addressed.  No physical exam could be performed with this format.  Patient verbally consented to a telehealth visit.   Date:  01/25/2020   ID:  [name], DOB [birth year], MRN [id number]  Patient Location: Home Provider Location: Office/Clinic  PCP:  Patient, No Pcp Per   Evaluation Performed: acute  Chief Complaint:  Cough  History of Present Illness:    [name] is a 59 y.o. male with a cough. Patient reports his cough had improved however, he has ran a fever. He was not able to check his temperature. Denies any body aches, SOB, N/V or diarrhea.  The patient does have symptoms concerning for COVID-19 infection (fever, chills, cough, or new shortness of breath).    Past Medical History:  Diagnosis Date  . Allergic  . Heart murmur   Past Surgical History:  Procedure Laterality Date  . FRACTURE SURGERY  . SPINE SURGERY   Current Medications: No current outpatient medications on file.   No current facility-administered medications for this visit.   Allergies: No Known Allergies  History Family History  Problem Relation Age of Onset  . Alcohol abuse Father  . Arthritis Father  . Colon cancer Father  . Mental illness Father  . Stroke Father  . Alcohol abuse Sister  . Early death Sister  . Kidney disease Sister   . Alcohol abuse Brother  . Alcohol abuse Daughter  . Mental illness Maternal Grandfather   Social History: Social History   Socioeconomic History  . Marital status: Married   No past medical history on file.  Past Surgical History:  Procedure Laterality Date  . SPINE SURGERY      No family history on file.  Social History   Socioeconomic History  . Marital status: Married    Spouse name: Not on file  . Number of children: Not on file  . Years of education: Not on file  . Highest education level: Not on file  Occupational History  . Not on file  Tobacco Use  . Smoking status: Never Smoker  Substance and Sexual Activity  . Alcohol use: Not on file  . Drug use: Not on file  . Sexual activity: Not on file  Other Topics Concern  . Not on file  Social History Narrative  . Not on file   Social Determinants of Health   Financial Resource Strain:   . Difficulty of Paying Living Expenses: Not on file  Food Insecurity:   . Worried About Programme researcher, broadcasting/film/video in the Last Year: Not on file  . Ran Out of Food in the Last Year: Not on file  Transportation Needs:   . Lack of Transportation (Medical): Not on file  . Lack of Transportation (Non-Medical): Not on file  Physical Activity:   . Days of Exercise per Week: Not on file  . Minutes of Exercise per Session:  Not on file  Stress:   . Feeling of Stress : Not on file  Social Connections:   . Frequency of Communication with Friends and Family: Not on file  . Frequency of Social Gatherings with Friends and Family: Not on file  . Attends Religious Services: Not on file  . Active Member of Clubs or Organizations: Not on file  . Attends Banker Meetings: Not on file  . Marital Status: Not on file  Intimate Partner Violence:   . Fear of Current or Ex-Partner: Not on file  . Emotionally Abused: Not on file  . Physically Abused: Not on file  . Sexually Abused: Not on file    Outpatient Medications Prior to  Visit  Medication Sig Dispense Refill  . diazepam (VALIUM) 10 MG tablet Take 10 mg by mouth every 6 (six) hours as needed.    Marland Kitchen HYDROcodone-acetaminophen (NORCO) 10-325 MG per tablet Take one half to one tablet daily as needed for severe back pain 30 tablet 0  . traMADol (ULTRAM) 50 MG tablet Take 50 mg by mouth every 6 (six) hours as needed.     No facility-administered medications prior to visit.   .med Allergies:   Patient has no known allergies.   Social History   Tobacco Use  . Smoking status: Never Smoker  Substance Use Topics  . Alcohol use: Not on file  . Drug use: Not on file     Review of Systems  Constitutional: Positive for chills, diaphoresis and fever (had one Tuesday).  HENT: Negative for sore throat.        PND  Respiratory: Positive for cough. Negative for sputum production.   Cardiovascular: Negative for chest pain.  Gastrointestinal: Negative for diarrhea, nausea and vomiting.  Musculoskeletal: Positive for joint pain. Negative for myalgias.  Neurological: Positive for headaches (Mon-Wed a slight h/a).     Labs/Other Tests and Data Reviewed:    Recent Labs: No results found for requested labs within last 8760 hours.   Recent Lipid Panel Lab Results  Component Value Date/Time   CHOL 171 09/14/2011 04:47 PM   TRIG 157 (H) 09/14/2011 04:47 PM   HDL 37 (L) 09/14/2011 04:47 PM   CHOLHDL 4.6 09/14/2011 04:47 PM   LDLCALC 103 (H) 09/14/2011 04:47 PM    Wt Readings from Last 3 Encounters:  06/15/13 163 lb (73.9 kg)  09/14/11 152 lb (68.9 kg)     Objective:    Vital Signs:  There were no vitals taken for this visit.   Physical Exam  No exam done. Did not sound dyspneic on the phone.  ASSESSMENT & PLAN:   1. Acute bronchitis due to COVID-19 virus Patient instructed to drink plenty of fluids, rest, eat three meals per day.  I was going to send tessalon perles, but pt left prior to [place] discussing. He was in town to visit his daughter, one of our front  staff, and chose to immediately return to his home town, [place].  2. Fever, unspecified fever cause - POC COVID-19 positive. - Influenza A/B negative.    COVID-19 Education: The signs and symptoms of COVID-19 were discussed with the patient and how to seek care for testing (follow up with PCP or arrange E-visit). The importance of social distancing was discussed today.  Time:   Today, I have spent 10 minutes with the patient with telehealth technology discussing the above problems.    Follow Up:  Virtual Visit  prn  Signed, Horald Pollen,  CMA  01/25/2020 8:29 AM    Carter Kaman [employer]
